# Patient Record
Sex: Male | Born: 1990 | Race: White | Hispanic: No | Marital: Single | Smoking: Current every day smoker
Health system: Southern US, Community
[De-identification: ages and names within clinical notes are randomized; demographics above are authoritative.]

---
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
## 2012-09-05 ENCOUNTER — Emergency Department (HOSPITAL_COMMUNITY): Payer: Self-pay

## 2012-09-05 ENCOUNTER — Emergency Department (HOSPITAL_COMMUNITY)
Admission: EM | Admit: 2012-09-05 | Discharge: 2012-09-05 | Disposition: A | Discharge status: Home / Self Care | Payer: Self-pay | Attending: Emergency Medicine | Admitting: Emergency Medicine

## 2012-09-05 ENCOUNTER — Encounter (HOSPITAL_COMMUNITY): Payer: Self-pay | Admitting: *Deleted

## 2012-09-05 DIAGNOSIS — S93409A Sprain of unspecified ligament of unspecified ankle, initial encounter: Secondary | ICD-10-CM | POA: Insufficient documentation

## 2012-09-05 DIAGNOSIS — Y9367 Activity, basketball: Secondary | ICD-10-CM | POA: Insufficient documentation

## 2012-09-05 DIAGNOSIS — Y9239 Other specified sports and athletic area as the place of occurrence of the external cause: Secondary | ICD-10-CM | POA: Insufficient documentation

## 2012-09-05 DIAGNOSIS — S93402A Sprain of unspecified ligament of left ankle, initial encounter: Secondary | ICD-10-CM

## 2012-09-05 DIAGNOSIS — Y92838 Other recreation area as the place of occurrence of the external cause: Secondary | ICD-10-CM | POA: Insufficient documentation

## 2012-09-05 DIAGNOSIS — X500XXA Overexertion from strenuous movement or load, initial encounter: Secondary | ICD-10-CM | POA: Insufficient documentation

## 2012-09-05 MED ORDER — IBUPROFEN 600 MG PO TABS: 600.0000 mg | ORAL_TABLET | Freq: Four times a day (QID) | ORAL | Status: AC | PRN

## 2012-09-05 NOTE — ED Notes (Addendum)
Pt reports playing basketball and landing wrong on L ankle. C/o pain and swelling.

## 2012-09-05 NOTE — ED Provider Notes (Signed)
Medical screening examination/treatment/procedure(s) were performed by non-physician practitioner and as supervising physician I was immediately available for consultation/collaboration.  Raeford Razor, MD 09/05/12 [phone]

## 2012-09-05 NOTE — ED Provider Notes (Signed)
History     CSN: [id number]  Arrival date & time 09/05/12  1112   First MD Initiated Contact with Patient 09/05/12 1116      Chief Complaint  Patient presents with  . Ankle Injury    (Consider location/radiation/quality/duration/timing/severity/associated sxs/prior treatment) HPI Comments: Patient presents with complaint of left ankle injury sustained yesterday at approximately 6 PM when he was playing basketball. Patient states that he jumped up and landed on the foot of another player and inverted his ankle. Patient states that he could see the bottom of his foot when it bent sideways. He has been unable to bear weight on the foot since the injury. He treated himself with ice, elevation, ibuprofen at home and this is not improved symptoms. He is using crutches to ambulate. Patient denies numbness or tingling of the foot. Onset of symptoms acute. Course is constant. Palpation or movement makes the pain worse. Nothing makes it better.  The history is provided by the patient.    History reviewed. No pertinent past medical history.  History reviewed. No pertinent past surgical history.  No family history on file.  History  Substance Use Topics  . Smoking status: Never Smoker   . Smokeless tobacco: Not on file  . Alcohol Use: Yes     Comment: occasionally      Review of Systems  Constitutional: Negative for activity change.  HENT: Negative for neck pain.   Musculoskeletal: Positive for joint swelling, arthralgias and gait problem. Negative for back pain.  Skin: Negative for wound.  Neurological: Negative for weakness and numbness.    Allergies  Review of patient's allergies indicates no known allergies.  Home Medications   Current Outpatient Rx  Name  Route  Sig  Dispense  Refill  . ibuprofen (ADVIL,MOTRIN) 600 MG tablet   Oral   Take 1 tablet (600 mg total) by mouth every 6 (six) hours as needed for pain.   20 tablet   0     BP 145/89  Pulse 65  Temp(Src)  98.4 F (36.9 C) (Oral)  Resp 16  SpO2 98%  Physical Exam  Vitals reviewed. Constitutional: He appears well-developed and well-nourished.  HENT:  Head: Normocephalic and atraumatic.  Eyes: Conjunctivae are normal.  Neck: Normal range of motion. Neck supple.  Cardiovascular:  Pulses:      Dorsalis pedis pulses are 2+ on the right side, and 2+ on the left side.       Posterior tibial pulses are 2+ on the right side, and 2+ on the left side.  Pulmonary/Chest: No respiratory distress.  Musculoskeletal: Normal range of motion. He exhibits edema and tenderness.  Patient complains of pain with palpation of the medial/lateral left ankle. He denies pain with palpation over the fibular head of the affected side. He denies pain in the hip of the affected side.  Neurological: He is alert.  Distal motor, sensation, and vascular intact.  Skin: Skin is warm and dry.  Psychiatric: He has a normal mood and affect.    ED Course  Procedures (including critical care time)  Labs Reviewed - No data to display Dg Ankle Complete Left  09/05/2012  *RADIOLOGY REPORT*  Clinical Data: Left ankle pain after injury.  LEFT ANKLE COMPLETE - 3+ VIEW  Comparison: None.  Findings: No fracture or dislocation is noted.  Joint spaces are intact.  Soft tissue swelling is seen over lateral malleolus suggesting ligamentous injury.  IMPRESSION: No fracture or dislocation is seen.  Soft tissue swelling over  lateral malleolus is noted suggesting ligamentous injury.   Original Report Authenticated By: Lupita Raider.,  M.D.      1. Ankle sprain, left, initial encounter     11:20 AM Patient seen and examined. Work-up initiated.   Vital signs reviewed and are as follows: Filed Vitals:   09/05/12 1131  BP: 145/89  Pulse: 65  Temp: 98.4 F (36.9 C)  Resp: 16   12:16 PM Xray reviewed by myself. Patient informed. ASO by orthopedic technician.  Patient was counseled on RICE protocol and told to rest injury, use ice  for no longer than 15 minutes every hour, compress the area, and elevate above the level of their heart as much as possible to reduce swelling.  Questions answered.  Patient verbalized understanding.    Encouraged to followup with orthopedist if not improved in one week.  MDM  Ankle injury, negative x-rays. ASO on supportive treatment indicated with orthopedic followup if not improving.        Renne Crigler, PA-C 09/05/12 1217

## 2012-09-05 NOTE — Progress Notes (Signed)
During [facility] ED 09/05/12 visit CM spoke with pt who confirms self pay [facility] resident with no pcp. CM discussed and provided written information for self pay pcps, importance of pcp for f/u care, www.needymeds.org, discounted pharmacies, MATCH program and other [place] resources such as financial assistance, DSS and  health department Reviewed Health connect number to assist with finding self pay provider close to pt's residence. Reviewed resources for [place] self pay pcps like [facility], family medicine at [address], [facility] family practice, general medical clinics, [facility] urgent care plus others, [facility] out patient pharmacies, housing, affordable care act/health reform (deadline 09/11/12) and other resources in [employer]. Pt voiced understanding and appreciation of resources provided  Pt has not signed up for affordable care act marketplace

## 2012-11-15 ENCOUNTER — Encounter (HOSPITAL_COMMUNITY): Payer: Self-pay | Admitting: *Deleted

## 2012-11-15 ENCOUNTER — Emergency Department (INDEPENDENT_AMBULATORY_CARE_PROVIDER_SITE_OTHER)
Admission: EM | Admit: 2012-11-15 | Discharge: 2012-11-15 | Disposition: A | Discharge status: Home / Self Care | Payer: Self-pay | Source: Home / Self Care | Attending: Family Medicine | Admitting: Family Medicine

## 2012-11-15 DIAGNOSIS — S39012A Strain of muscle, fascia and tendon of lower back, initial encounter: Secondary | ICD-10-CM

## 2012-11-15 MED ORDER — KETOROLAC TROMETHAMINE 10 MG PO TABS: 10.0000 mg | ORAL_TABLET | Freq: Four times a day (QID) | ORAL | Status: AC | PRN

## 2012-11-15 MED ORDER — CYCLOBENZAPRINE HCL 5 MG PO TABS: 5.0000 mg | ORAL_TABLET | Freq: Three times a day (TID) | ORAL | Status: AC | PRN

## 2012-11-15 NOTE — ED Notes (Signed)
Pt  Reports  Low  Back  Pain  X  3  Days       Pt  Is  Worse  On  Movements  And  When  He  Is  Sitting  Down    He  denys  Any  specefic  Injury   Other  Than  Lifting  heavt  Tables  Prior  To  The  Symptoms   He  denys  Any  Urinary  Symptoms  Or loss  Of  Bowel  control

## 2012-11-15 NOTE — ED Provider Notes (Signed)
History     CSN: [id number]  Arrival date & time 11/15/12  1057   First MD Initiated Contact with Patient 11/15/12 1228      Chief Complaint  Patient presents with  . Back Pain    (Consider location/radiation/quality/duration/timing/severity/associated sxs/prior treatment) Patient is a 22 y.o. male presenting with back pain. The history is provided by the patient.  Back Pain Location:  Sacro-iliac joint Quality:  Burning Radiates to:  Does not radiate Pain severity:  Mild Onset quality:  Sudden Duration:  3 days Timing:  Constant Progression:  Unchanged Chronicity:  New Context: lifting heavy objects   Associated symptoms: no abdominal pain, no abdominal swelling, no bladder incontinence, no dysuria and no fever     History reviewed. No pertinent past medical history.  History reviewed. No pertinent past surgical history.  History reviewed. No pertinent family history.  History  Substance Use Topics  . Smoking status: Never Smoker   . Smokeless tobacco: Not on file  . Alcohol Use: Yes     Comment: occasionally      Review of Systems  Constitutional: Negative.  Negative for fever.  Gastrointestinal: Negative.  Negative for abdominal pain.  Genitourinary: Negative.  Negative for bladder incontinence and dysuria.  Musculoskeletal: Positive for back pain. Negative for myalgias, joint swelling and gait problem.    Allergies  Review of patient's allergies indicates no known allergies.  Home Medications   Current Outpatient Rx  Name  Route  Sig  Dispense  Refill  . cyclobenzaprine (FLEXERIL) 5 MG tablet   Oral   Take 1 tablet (5 mg total) by mouth 3 (three) times daily as needed for muscle spasms.   30 tablet   0   . ibuprofen (ADVIL,MOTRIN) 600 MG tablet   Oral   Take 1 tablet (600 mg total) by mouth every 6 (six) hours as needed for pain.   20 tablet   0   . ketorolac (TORADOL) 10 MG tablet   Oral   Take 1 tablet (10 mg total) by mouth every 6 (six)  hours as needed for pain.   30 tablet   0     BP 130/79  Pulse 67  Temp(Src) 98.4 F (36.9 C) (Oral)  Resp 16  SpO2 100%  Physical Exam  Nursing note and vitals reviewed. Constitutional: He is oriented to person, place, and time. He appears well-developed and well-nourished.  Abdominal: Soft. Bowel sounds are normal.  Musculoskeletal: He exhibits tenderness.       Lumbar back: He exhibits bony tenderness and pain. He exhibits normal range of motion, no tenderness, no swelling, no spasm and normal pulse.  Neurological: He is alert and oriented to person, place, and time.  Skin: Skin is warm and dry.    ED Course  Procedures (including critical care time)  Labs Reviewed - No data to display No results found.   1. Lumbosacral strain, initial encounter       MDM          Linna Hoff, MD 11/15/12 1329

## 2013-05-25 ENCOUNTER — Encounter (HOSPITAL_COMMUNITY): Payer: Self-pay | Admitting: Emergency Medicine

## 2013-05-25 ENCOUNTER — Emergency Department (HOSPITAL_COMMUNITY)
Admission: EM | Admit: 2013-05-25 | Discharge: 2013-05-25 | Disposition: A | Discharge status: Home / Self Care | Payer: Self-pay | Attending: Emergency Medicine | Admitting: Emergency Medicine

## 2013-05-25 DIAGNOSIS — Z113 Encounter for screening for infections with a predominantly sexual mode of transmission: Secondary | ICD-10-CM | POA: Insufficient documentation

## 2013-05-25 DIAGNOSIS — Z202 Contact with and (suspected) exposure to infections with a predominantly sexual mode of transmission: Secondary | ICD-10-CM

## 2013-05-25 MED ORDER — CEFTRIAXONE SODIUM 250 MG IJ SOLR
250.0000 mg | Freq: Once | INTRAMUSCULAR | Status: AC
  Administered 2013-05-25: 250 mg via INTRAMUSCULAR
  Filled 2013-05-25: qty 250

## 2013-05-25 MED ORDER — AZITHROMYCIN 250 MG PO TABS
1000.0000 mg | ORAL_TABLET | Freq: Once | ORAL | Status: AC
  Administered 2013-05-25: 1000 mg via ORAL
  Filled 2013-05-25: qty 4

## 2013-05-25 NOTE — ED Notes (Signed)
Pt states dating girl who tested positive for chlamydia and wants to be tested, denies drainage, painful urination

## 2013-05-25 NOTE — ED Provider Notes (Signed)
CSN: [id number]     Arrival date & time 05/25/13  1224 History  This chart was scribed for non-physician practitioner, Fayrene Helper, PA-C working with Enid Skeens, MD by Greggory Stallion, ED scribe. This patient was seen in room WTR9/WTR9 and the patient's care was started at 12:49 PM.   Chief Complaint  Patient presents with  . Exposure to STD   The history is provided by the patient. No language interpreter was used.   HPI Comments: Bryan Solis is a 22 y.o. male who presents to the Emergency Department complaining of STD exposure. Pt states he is dating a girl that tested positive for chlamydia 4 days ago and would like to be tested for it. States he has never had an STD in the past. He states he has had several sexual partners in the last six months. Denies abdominal pain, testicular pain, penile discharge, dysuria, rash.   No past medical history on file. No past surgical history on file. No family history on file. History  Substance Use Topics  . Smoking status: Never Smoker   . Smokeless tobacco: Not on file  . Alcohol Use: Yes     Comment: occasionally    Review of Systems  Gastrointestinal: Negative for abdominal pain.  Genitourinary: Negative for dysuria, discharge and testicular pain.  Skin: Negative for rash.    Allergies  Review of patient's allergies indicates no known allergies.  Home Medications   Current Outpatient Rx  Name  Route  Sig  Dispense  Refill  . cyclobenzaprine (FLEXERIL) 5 MG tablet   Oral   Take 1 tablet (5 mg total) by mouth 3 (three) times daily as needed for muscle spasms.   30 tablet   0   . ibuprofen (ADVIL,MOTRIN) 600 MG tablet   Oral   Take 1 tablet (600 mg total) by mouth every 6 (six) hours as needed for pain.   20 tablet   0   . ketorolac (TORADOL) 10 MG tablet   Oral   Take 1 tablet (10 mg total) by mouth every 6 (six) hours as needed for pain.   30 tablet   0    BP 150/86  Pulse 107  Temp(Src) 97.5 F (36.4 C) (Oral)   Resp 17  SpO2 99%  Physical Exam  Nursing note and vitals reviewed. Constitutional: He is oriented to person, place, and time. He appears well-developed and well-nourished. No distress.  HENT:  Head: Normocephalic and atraumatic.  Eyes: EOM are normal.  Neck: Neck supple. No tracheal deviation present.  Cardiovascular: Normal rate, regular rhythm and normal heart sounds.  Exam reveals no gallop and no friction rub.   No murmur heard. Pulmonary/Chest: Effort normal and breath sounds normal. No respiratory distress. He has no wheezes. He has no rales.  Abdominal: Hernia confirmed negative in the right inguinal area and confirmed negative in the left inguinal area.  Genitourinary: Rectum normal, testes normal and penis normal. Circumcised.  No obvious discharge. No rash.   Musculoskeletal: Normal range of motion.  Lymphadenopathy:       Right: No inguinal adenopathy present.       Left: No inguinal adenopathy present.  Neurological: He is alert and oriented to person, place, and time.  Skin: Skin is warm and dry.  Psychiatric: He has a normal mood and affect. His behavior is normal.    ED Course  Procedures (including critical care time)  DIAGNOSTIC STUDIES: Oxygen Saturation is 99% on RA, normal by my interpretation.  COORDINATION OF CARE: 12:57 PM-Discussed treatment plan which includes GC swab and treatment for chlamydia with pt at bedside and pt agreed to plan.   Labs Review Labs Reviewed - No data to display Imaging Review No results found.  EKG Interpretation   None       MDM   1. Exposure to STD    BP 150/86  Pulse 107  Temp(Src) 97.5 F (36.4 C) (Oral)  Resp 17  SpO2 99%  I personally performed the services described in this documentation, which was scribed in my presence. The recorded information has been reviewed and is accurate.    Fayrene Helper, PA-C 05/25/13 1306

## 2013-05-25 NOTE — ED Provider Notes (Signed)
Medical screening examination/treatment/procedure(s) were performed by non-physician practitioner and as supervising physician I was immediately available for consultation/collaboration.  EKG Interpretation   None         Enid Skeens, MD 05/25/13 2041

## 2013-05-26 LAB — GC/CHLAMYDIA PROBE AMP: CT Probe RNA: POSITIVE — AB

## 2013-05-27 ENCOUNTER — Telehealth (HOSPITAL_COMMUNITY): Payer: Self-pay | Admitting: Emergency Medicine

## 2013-05-27 NOTE — ED Notes (Signed)
Patient has +Chlamydia. 

## 2013-05-27 NOTE — ED Notes (Signed)
+  Chlamydia. Patient treated with Rocephin and Zithromax. [employer] faxed. 

## 2014-10-13 ENCOUNTER — Emergency Department (HOSPITAL_BASED_OUTPATIENT_CLINIC_OR_DEPARTMENT_OTHER): Payer: Self-pay

## 2014-10-13 ENCOUNTER — Emergency Department (HOSPITAL_BASED_OUTPATIENT_CLINIC_OR_DEPARTMENT_OTHER)
Admission: EM | Admit: 2014-10-13 | Discharge: 2014-10-13 | Disposition: A | Discharge status: Home / Self Care | Payer: Self-pay | Attending: Emergency Medicine | Admitting: Emergency Medicine

## 2014-10-13 ENCOUNTER — Encounter (HOSPITAL_BASED_OUTPATIENT_CLINIC_OR_DEPARTMENT_OTHER): Payer: Self-pay | Admitting: *Deleted

## 2014-10-13 DIAGNOSIS — Z23 Encounter for immunization: Secondary | ICD-10-CM | POA: Insufficient documentation

## 2014-10-13 DIAGNOSIS — IMO0001 Reserved for inherently not codable concepts without codable children: Secondary | ICD-10-CM

## 2014-10-13 DIAGNOSIS — T81509A Unspecified complication of foreign body accidentally left in body following unspecified procedure, initial encounter: Secondary | ICD-10-CM

## 2014-10-13 DIAGNOSIS — Y9389 Activity, other specified: Secondary | ICD-10-CM | POA: Insufficient documentation

## 2014-10-13 DIAGNOSIS — W25XXXA Contact with sharp glass, initial encounter: Secondary | ICD-10-CM | POA: Insufficient documentation

## 2014-10-13 DIAGNOSIS — Y9289 Other specified places as the place of occurrence of the external cause: Secondary | ICD-10-CM | POA: Insufficient documentation

## 2014-10-13 DIAGNOSIS — Y998 Other external cause status: Secondary | ICD-10-CM | POA: Insufficient documentation

## 2014-10-13 DIAGNOSIS — S60551A Superficial foreign body of right hand, initial encounter: Secondary | ICD-10-CM | POA: Insufficient documentation

## 2014-10-13 MED ORDER — LIDOCAINE-EPINEPHRINE 2 %-1:100000 IJ SOLN
10.0000 mL | Freq: Once | INTRAMUSCULAR | Status: AC
  Administered 2014-10-13: 10 mL

## 2014-10-13 MED ORDER — LIDOCAINE-EPINEPHRINE 2 %-1:100000 IJ SOLN
INTRAMUSCULAR | Status: AC
  Administered 2014-10-13: 10 mL
  Filled 2014-10-13: qty 1

## 2014-10-13 MED ORDER — TETANUS-DIPHTH-ACELL PERTUSSIS 5-2.5-18.5 LF-MCG/0.5 IM SUSP
0.5000 mL | Freq: Once | INTRAMUSCULAR | Status: AC
Start: 2014-10-13 — End: 2014-10-13
  Administered 2014-10-13: 0.5 mL via INTRAMUSCULAR
  Filled 2014-10-13: qty 0.5
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
## 2014-10-13 NOTE — ED Notes (Addendum)
Pt back from xray, alert, NAD, calm, steady gait.

## 2014-10-13 NOTE — ED Notes (Signed)
I completed wound care.
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
## 2015-03-10 ENCOUNTER — Encounter (HOSPITAL_COMMUNITY): Payer: Self-pay | Admitting: Emergency Medicine

## 2015-03-10 ENCOUNTER — Emergency Department (HOSPITAL_COMMUNITY)
Admission: EM | Admit: 2015-03-10 | Discharge: 2015-03-10 | Disposition: A | Discharge status: Home / Self Care | Payer: Self-pay | Attending: Emergency Medicine | Admitting: Emergency Medicine

## 2015-03-10 DIAGNOSIS — X58XXXA Exposure to other specified factors, initial encounter: Secondary | ICD-10-CM | POA: Insufficient documentation

## 2015-03-10 DIAGNOSIS — T782XXA Anaphylactic shock, unspecified, initial encounter: Secondary | ICD-10-CM

## 2015-03-10 DIAGNOSIS — Y998 Other external cause status: Secondary | ICD-10-CM | POA: Insufficient documentation

## 2015-03-10 DIAGNOSIS — Z720 Tobacco use: Secondary | ICD-10-CM | POA: Insufficient documentation

## 2015-03-10 DIAGNOSIS — Y9389 Activity, other specified: Secondary | ICD-10-CM | POA: Insufficient documentation

## 2015-03-10 DIAGNOSIS — Y9289 Other specified places as the place of occurrence of the external cause: Secondary | ICD-10-CM | POA: Insufficient documentation

## 2015-03-10 MED ORDER — EPINEPHRINE 0.3 MG/0.3ML IJ SOAJ: 0.3000 mg | Freq: Once | INTRAMUSCULAR | Status: AC

## 2015-03-10 MED ORDER — METHYLPREDNISOLONE SODIUM SUCC 125 MG IJ SOLR
125.0000 mg | Freq: Once | INTRAMUSCULAR | Status: AC
  Administered 2015-03-10: 125 mg via INTRAVENOUS
  Filled 2015-03-10: qty 2

## 2015-03-10 MED ORDER — DIPHENHYDRAMINE HCL 50 MG/ML IJ SOLN
50.0000 mg | Freq: Once | INTRAMUSCULAR | Status: AC
  Administered 2015-03-10: 50 mg via INTRAVENOUS

## 2015-03-10 MED ORDER — DIPHENHYDRAMINE HCL 50 MG/ML IJ SOLN
INTRAMUSCULAR | Status: DC
Start: 2015-03-10 — End: 2015-03-11
  Filled 2015-03-10: qty 1

## 2015-03-10 MED ORDER — FAMOTIDINE IN NACL 20-0.9 MG/50ML-% IV SOLN
20.0000 mg | Freq: Once | INTRAVENOUS | Status: AC
  Administered 2015-03-10: 20 mg via INTRAVENOUS
  Filled 2015-03-10: qty 50

## 2015-03-10 MED ORDER — PREDNISONE 20 MG PO TABS: ORAL_TABLET | ORAL | Status: AC

## 2015-03-10 MED ORDER — EPINEPHRINE 0.3 MG/0.3ML IJ SOAJ
INTRAMUSCULAR | Status: AC
  Filled 2015-03-10: qty 0.3

## 2015-03-10 MED ORDER — ONDANSETRON HCL 4 MG/2ML IJ SOLN
4.0000 mg | Freq: Once | INTRAMUSCULAR | Status: AC
  Administered 2015-03-10: 4 mg via INTRAVENOUS

## 2015-03-10 MED ORDER — DIPHENHYDRAMINE HCL 25 MG PO TABS: 25.0000 mg | ORAL_TABLET | ORAL | Status: AC | PRN

## 2015-03-10 MED ORDER — FAMOTIDINE 20 MG PO TABS: 20.0000 mg | ORAL_TABLET | Freq: Two times a day (BID) | ORAL | Status: DC

## 2015-03-10 MED ORDER — EPINEPHRINE 0.3 MG/0.3ML IJ SOAJ
0.3000 mg | Freq: Once | INTRAMUSCULAR | Status: AC
  Administered 2015-03-10: 0.3 mg via INTRAMUSCULAR

## 2015-03-10 NOTE — Progress Notes (Signed)
EDCM spoke to patient at bedside. Patient confirms she does not have a pcp or insurance living in [place].  EDCM provided patient with contact information to [facility], informed patient of services there.  EDCM also provided patient with list of pcps who accept self pay patients, list of discount pharmacies and websites needymeds.org and GoodRX.com for medication assistance, phone number to inquire about the orange card, phone number to inquire about Mediciad, phone number to inquire about the Affordable Care Act, financial resources in the community such as local churches, [employer], urban ministries, and dental assistance for uninsured patients.  Patient thankful  for resources.  No further EDCM needs at this time. 

## 2015-03-10 NOTE — ED Notes (Signed)
Pt states that he was outside working picking up tree limbs when he was bitten by couple black ants.  Pt having hives and vomiting.

## 2015-03-10 NOTE — ED Provider Notes (Signed)
CSN: [id number]     Arrival date & time 03/10/15  1721 History   First MD Initiated Contact with Patient 03/10/15 1725     Chief Complaint  Patient presents with  . Allergic Reaction     (Consider location/radiation/quality/duration/timing/severity/associated sxs/prior Treatment) Patient is a 24 y.o. male presenting with allergic reaction and general illness. The history is provided by the patient.  Allergic Reaction Presenting symptoms: rash   Presenting symptoms: no difficulty breathing and no difficulty swallowing   Severity:  Mild Prior allergic episodes:  No prior episodes Context: insect bite/sting   Context: no animal exposure, no food allergies, no grass and no nuts   Relieved by:  None tried Worsened by:  Nothing tried Ineffective treatments:  None tried Illness Severity:  Severe Onset quality:  Gradual Duration:  2 hours Timing:  Constant Progression:  Worsening Chronicity:  New Associated symptoms: nausea, rash and vomiting   Associated symptoms: no fever     History reviewed. No pertinent past medical history. History reviewed. No pertinent past surgical history. No family history on file. Social History  Substance Use Topics  . Smoking status: Current Every Day Smoker    Types: Cigarettes  . Smokeless tobacco: None  . Alcohol Use: Yes     Comment: occasionally    Review of Systems  Constitutional: Negative for fever and chills.  HENT: Negative for trouble swallowing.   Gastrointestinal: Positive for nausea and vomiting.  Endocrine: Negative for polydipsia and polyuria.  Genitourinary: Negative for dysuria and enuresis.  Skin: Positive for rash.  Neurological: Positive for syncope (near).  All other systems reviewed and are negative.     Allergies  Review of patient's allergies indicates no known allergies.  Home Medications   Prior to Admission medications   Medication Sig Start Date End Date Taking? Authorizing Provider   aspirin-acetaminophen-caffeine (EXCEDRIN MIGRAINE) [phone] MG per tablet Take 1 tablet by mouth every 6 (six) hours as needed for headache.   Yes Historical Provider, MD  cyclobenzaprine (FLEXERIL) 5 MG tablet Take 1 tablet (5 mg total) by mouth 3 (three) times daily as needed for muscle spasms. Patient not taking: Reported on 03/10/2015 11/15/12   Linna Hoff, MD  ibuprofen (ADVIL,MOTRIN) 600 MG tablet Take 1 tablet (600 mg total) by mouth every 6 (six) hours as needed for pain. Patient not taking: Reported on 03/10/2015 09/05/12   Renne Crigler, PA-C  ketorolac (TORADOL) 10 MG tablet Take 1 tablet (10 mg total) by mouth every 6 (six) hours as needed for pain. Patient not taking: Reported on 03/10/2015 11/15/12   Linna Hoff, MD   BP 106/62 mmHg  Pulse 91  Temp(Src) 98.2 F (36.8 C) (Oral)  Resp 21  Ht  (1.778 m)  Wt 200 lb (90.719 kg)  BMI 28.70 kg/m2  SpO2 97% Physical Exam  Constitutional: He is oriented to person, place, and time. He appears well-developed and well-nourished.  HENT:  Head: Normocephalic and atraumatic.  Eyes: Conjunctivae and EOM are normal.  Neck: Normal range of motion. Neck supple.  Cardiovascular: Normal rate and regular rhythm.   Pulmonary/Chest: Effort normal. No respiratory distress.  Abdominal: Soft. There is no tenderness. There is no rebound.  Musculoskeletal: Normal range of motion. He exhibits no edema or tenderness.  Neurological: He is alert and oriented to person, place, and time.  Skin: Skin is warm and dry. Rash noted. There is pallor.  Nursing note and vitals reviewed.   ED Course  Procedures (including critical care  time) CRITICAL CARE Performed by: Marily Memos   Total critical care time: 35 minutes  Critical care time was exclusive of separately billable procedures and treating other patients.  Critical care was necessary to treat or prevent imminent or life-threatening deterioration.  Critical care was time spent  personally by me on the following activities: development of treatment plan with patient and/or surrogate as well as nursing, discussions with consultants, evaluation of patient's response to treatment, examination of patient, obtaining history from patient or surrogate, ordering and performing treatments and interventions, ordering and review of laboratory studies, ordering and review of radiographic studies, pulse oximetry and re-evaluation of patient's condition.   Labs Review Labs Reviewed - No data to display  Imaging Review No results found. I have personally reviewed and evaluated these images and lab results as part of my medical decision-making.   EKG Interpretation None      MDM   Final diagnoses:  Anaphylaxis, initial encounter   Anaphylaxis, epi given. Had a vagal reaction shortly after epi shot (nauseated, low HR, low BP, pale skin, near syncope) recovered quickly. Will obs for at least four hours and continue meds at home with rx for epi pen. Multiple re-evaluations the patient improving rash, normal blood pressure is no vomiting or belly pain. There is enlarged for 5 hours with total resolution symptoms. Prescriptions for EpiPen, steroids, Pepcid and Benadryl given. Will follow with allergist as well.  I have personally and contemperaneously reviewed labs and imaging and used in my decision making as above.   A medical screening exam was performed and I feel the patient has had an appropriate workup for their chief complaint at this time and likelihood of emergent condition existing is low. They have been counseled on decision, discharge, follow up and which symptoms necessitate immediate return to the emergency department. They or their family verbally stated understanding and agreement with plan and discharged in stable condition.     Marily Memos, MD 03/11/15 0002

## 2015-05-01 ENCOUNTER — Encounter (HOSPITAL_COMMUNITY): Payer: Self-pay | Admitting: Emergency Medicine

## 2015-05-01 ENCOUNTER — Emergency Department (HOSPITAL_COMMUNITY)
Admission: EM | Admit: 2015-05-01 | Discharge: 2015-05-01 | Disposition: A | Discharge status: Home / Self Care | Payer: Self-pay | Attending: Emergency Medicine | Admitting: Emergency Medicine

## 2015-05-01 DIAGNOSIS — Z79899 Other long term (current) drug therapy: Secondary | ICD-10-CM | POA: Insufficient documentation

## 2015-05-01 DIAGNOSIS — T63444A Toxic effect of venom of bees, undetermined, initial encounter: Secondary | ICD-10-CM | POA: Insufficient documentation

## 2015-05-01 DIAGNOSIS — Y998 Other external cause status: Secondary | ICD-10-CM | POA: Insufficient documentation

## 2015-05-01 DIAGNOSIS — S0183XA Puncture wound without foreign body of other part of head, initial encounter: Secondary | ICD-10-CM | POA: Insufficient documentation

## 2015-05-01 DIAGNOSIS — Y9289 Other specified places as the place of occurrence of the external cause: Secondary | ICD-10-CM | POA: Insufficient documentation

## 2015-05-01 DIAGNOSIS — X58XXXA Exposure to other specified factors, initial encounter: Secondary | ICD-10-CM | POA: Insufficient documentation

## 2015-05-01 DIAGNOSIS — F17210 Nicotine dependence, cigarettes, uncomplicated: Secondary | ICD-10-CM | POA: Insufficient documentation

## 2015-05-01 DIAGNOSIS — Y9389 Activity, other specified: Secondary | ICD-10-CM | POA: Insufficient documentation

## 2015-05-01 MED ORDER — DEXAMETHASONE SODIUM PHOSPHATE 10 MG/ML IJ SOLN
10.0000 mg | Freq: Once | INTRAMUSCULAR | Status: AC
  Administered 2015-05-01: 10 mg via INTRAMUSCULAR
  Filled 2015-05-01: qty 1

## 2015-05-01 MED ORDER — FAMOTIDINE 20 MG PO TABS: 20.0000 mg | ORAL_TABLET | Freq: Two times a day (BID) | ORAL | Status: AC

## 2015-05-01 MED ORDER — DIPHENHYDRAMINE HCL 25 MG PO CAPS
50.0000 mg | ORAL_CAPSULE | Freq: Once | ORAL | Status: AC
  Administered 2015-05-01: 50 mg via ORAL
  Filled 2015-05-01: qty 2

## 2015-05-01 MED ORDER — FAMOTIDINE 20 MG PO TABS
20.0000 mg | ORAL_TABLET | Freq: Once | ORAL | Status: AC
  Administered 2015-05-01: 20 mg via ORAL
  Filled 2015-05-01: qty 1

## 2015-05-01 NOTE — Discharge Instructions (Signed)
1 to 2 tablets of 25 mg Benadryl pills every 4-6 hours as needed to a maximum of 300 mg per day. In addition, you may apply a topical hydrocortisone ointment to all affected areas except for the face.   Do not hesitate to call 911 or return to the emergency room if you develop any shortness of breath, wheezing, tongue or lip swelling.   Bee, Wasp, or [employer], wasps, and hornets are part of a family of insects that can sting people. These stings can cause pain and inflammation, but they are usually not serious. However, some people may have an allergic reaction to a sting. This can cause the symptoms to be more severe.  SYMPTOMS  Common symptoms of this condition include:   A red lump in the skin that sometimes has a tiny hole in the center. In some cases, a stinger may be in the center of the wound.  Pain and itching at the sting site.  Redness and swelling around the sting site. If you have an allergic reaction (localized allergic reaction), the swelling and redness may spread out from the sting site. In some cases, this reaction can continue to develop over the next 12-36 hours. In rare cases, a person may have a severe allergic reaction (anaphylactic reaction) to a sting. Symptoms of an anaphylactic reaction may include:   Wheezing or difficulty breathing.  Raised, itchy, red patches on the skin.  Nausea or vomiting.  Abdominal cramping.  Diarrhea.  Chest pain.  Fainting.  Redness of the face (flushing). DIAGNOSIS  This condition is usually diagnosed based on symptoms, medical history, and a physical exam. TREATMENT  Most stings can be treated with:   Icing to reduce swelling.  Medicines (antihistamines) to treat itching or an allergic reaction.  Medicines to help reduce pain. These may be medicines that you take by mouth, or medicated creams or lotions that you apply to your skin. If you were stung by a bee, the stinger and a small sac of poison may be in the  wound. This may be removed by brushing across it with a flat card, such as a credit card. Another method is to pinch the area and pull it out. These methods can help reduce the severity of the body's reaction to the sting.  HOME CARE INSTRUCTIONS   Wash the sting site daily with soap and water as told by your health care provider.  Apply or take over-the-counter and prescription medicines only as told by your health care provider.  If directed, apply ice to the sting area.  Put ice in a plastic bag.  Place a towel between your skin and the bag.  Leave the ice on for 20 minutes, 2-3 times per day.  Do not scratch the sting area.  To lessen pain, try using a paste that is made of water and baking soda. Rub the paste on the sting area and leave it on for 5 minutes.  If you had a severe allergic reaction to a sting, you may need:  To wear a medical bracelet or necklace that lists the allergy.  To learn when and how to use an anaphylaxis kit or epinephrine injection. Your family members may also need to learn this.  To carry an anaphylaxis kit with you at all times. SEEK MEDICAL CARE IF:   Your symptoms do not get better in 2-3 days.  You have redness, swelling, or pain that spreads beyond the area of the sting.  You  have a fever. SEEK IMMEDIATE MEDICAL CARE IF:  You have symptoms of a severe allergic reaction. These include:   Wheezing or difficulty breathing.  Chest pain.  Light-headedness or fainting.  Itchy, raised, red patches on the skin.  Nausea or vomiting.  Abdominal cramping.  Diarrhea.   This information is not intended to replace advice given to you by your health care provider. Make sure you discuss any questions you have with your health care provider.   Document Released: 05/31/2005 Document Revised: 02/19/2015 Document Reviewed: 10/16/2014 Elsevier Interactive Patient Education [employer].

## 2015-05-01 NOTE — ED Notes (Signed)
Facial swelling decreased. Sent home with ice pack and AVS. AVS explained in detail. No other c/c. No wheezing or SOB noted. Ambulatory with steady gait. Knows to continue taking Benadryl and using hydrocortisone cream.

## 2015-05-01 NOTE — ED Notes (Signed)
Pt stung by bee to right temple around 1:30 this afternoon. Area around right eye red and swollen, still able to open eye, no visual changes, no difficulty breathing or chest tightness. Pt states his feet were itching earlier, states the swelling and itching have both decreased over the last 30 minutes.

## 2015-05-01 NOTE — ED Provider Notes (Signed)
History  By signing my name below, I, Karle Plumber, attest that this documentation has been prepared under the direction and in the presence of [employer], PA-C. Electronically Signed: Karle Plumber, ED Scribe. 05/01/2015. 3:33 PM.  Chief Complaint  Patient presents with  . Allergic Reaction  . Insect Bite   The history is provided by the patient and medical records. No language interpreter was used.    HPI Comments:  Bryan Solis is a 24 y.o. male who presents to the Emergency Department complaining of a bee sting to the right temple that occurred approximately two hours ago. Pt reports associated swelling to the right eye and bilateral feet itching that has since resolved. He also reports some SOB that has resolved as well. He states about one month ago he was bitten by black ants and had completely worse symptoms that included the bilateral feet itching. He states he was concerned because the feet were itching and wanted to make sure he did not experience the same symptoms as before. He has not taken anything as treatment for the symptoms. He denies modifying factors. He denies current SOB, nausea, vomiting, wheezing, tongue or lip swelling, difficulty swallowing or rash. He states his tetanus vaccination is UTD.  History reviewed. No pertinent past medical history. History reviewed. No pertinent past surgical history. History reviewed. No pertinent family history. Social History  Substance Use Topics  . Smoking status: Current Every Day Smoker    Types: Cigarettes  . Smokeless tobacco: None  . Alcohol Use: Yes     Comment: occasionally    Review of Systems A complete 10 system review of systems was obtained and all systems are negative except as noted in the HPI and PMH.   Allergies  Review of patient's allergies indicates no known allergies.  Home Medications   Prior to Admission medications   Medication Sig Start Date End Date Taking? Authorizing Provider   aspirin-acetaminophen-caffeine (EXCEDRIN MIGRAINE) [phone] MG per tablet Take 1 tablet by mouth every 6 (six) hours as needed for headache.    Historical Provider, MD  cyclobenzaprine (FLEXERIL) 5 MG tablet Take 1 tablet (5 mg total) by mouth 3 (three) times daily as needed for muscle spasms. Patient not taking: Reported on 03/10/2015 11/15/12   Linna Hoff, MD  diphenhydrAMINE (BENADRYL) 25 MG tablet Take 1 tablet (25 mg total) by mouth every 4 (four) hours as needed for itching. 03/10/15   Marily Memos, MD  EPINEPHrine 0.3 mg/0.3 mL IJ SOAJ injection Inject 0.3 mLs (0.3 mg total) into the muscle once. 03/10/15   Marily Memos, MD  famotidine (PEPCID) 20 MG tablet Take 1 tablet (20 mg total) by mouth 2 (two) times daily. 03/10/15   Marily Memos, MD  ibuprofen (ADVIL,MOTRIN) 600 MG tablet Take 1 tablet (600 mg total) by mouth every 6 (six) hours as needed for pain. Patient not taking: Reported on 03/10/2015 09/05/12   Renne Crigler, PA-C  ketorolac (TORADOL) 10 MG tablet Take 1 tablet (10 mg total) by mouth every 6 (six) hours as needed for pain. Patient not taking: Reported on 03/10/2015 11/15/12   Linna Hoff, MD  predniSONE (DELTASONE) 20 MG tablet 2 tabs po daily x 4 days 03/11/15   Marily Memos, MD   Triage Vitals: BP 132/83 mmHg  Pulse 107  Temp(Src) 98.1 F (36.7 C) (Oral)  Resp 18  SpO2 98% Physical Exam  Constitutional: He is oriented to person, place, and time. He appears well-developed and well-nourished. No distress.  HENT:  Head: Normocephalic and atraumatic.  Mouth/Throat: Oropharynx is clear and moist.  Eyes: Conjunctivae and EOM are normal. Pupils are equal, round, and reactive to light.  Puncture wound to right temple. Mild right-sided periorbital edema without warmth.  Neck: Normal range of motion.  Cardiovascular: Normal rate, regular rhythm and intact distal pulses.   Pulmonary/Chest: Effort normal and breath sounds normal.  No stridor or drooling. No posterior pharynx  edema, lip or tongue swelling. Pt reclining comfortably, speaking in complete sentences.   No Wheezing, excellent air movement in all fields.     Abdominal: Soft. Bowel sounds are normal. There is no tenderness.  Musculoskeletal: Normal range of motion.  Neurological: He is alert and oriented to person, place, and time.  Skin: Skin is warm and dry. No rash noted. He is not diaphoretic.  Psychiatric: He has a normal mood and affect. His behavior is normal.  Nursing note and vitals reviewed.   ED Course  Procedures (including critical care time) DIAGNOSTIC STUDIES: Oxygen Saturation is 98% on RA, normal by my interpretation.   COORDINATION OF CARE: 3:27 PM- Will order injection of Decadron, oral Benadryl and Pepcid. Will monitor patient before discharge. Return precautions discussed. Advised pt to apply ice to the area. Pt verbalizes understanding and agrees to plan.  Medications  dexamethasone (DECADRON) injection 10 mg (not administered)  famotidine (PEPCID) tablet 20 mg (not administered)  diphenhydrAMINE (BENADRYL) capsule 50 mg (not administered)    MDM   Final diagnoses:  None    Filed Vitals:   05/01/15 1511  BP: 132/83  Pulse: 107  Temp: 98.1 F (36.7 C)  TempSrc: Oral  Resp: 18  SpO2: 98%    Medications  dexamethasone (DECADRON) injection 10 mg (10 mg Intramuscular Given 05/01/15 1551)  famotidine (PEPCID) tablet 20 mg (20 mg Oral Given 05/01/15 1551)  diphenhydrAMINE (BENADRYL) capsule 50 mg (50 mg Oral Given 05/01/15 1551)    Bryan Solis is 24 y.o. male presenting with bee sting to right temple and periorbital edema. Patient states his feet were itching earlier and that prompted him to seek care because he had an anaphylactic reaction that started with his feet itching. 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 There was no medication administered beforehand, EpiPen was not given.   Does not meet criteria for anaphylaxis and  epinephrine administration: no multisystem involvement, airway compromise or hypotension. Pt given decadron, benadryl and pepcid with significant improvement in ED.   Recheck in the ED shows slightly improved periorbital edema and normal lung, heart and skin exam, no indication for epinephrine administration, not a anaphylactic reaction.  Evaluation does not show pathology that would require ongoing emergent intervention or inpatient treatment. Pt is hemodynamically stable and mentating appropriately. Discussed findings and plan with patient/guardian, who agrees with care plan. All questions answered. Return precautions discussed and outpatient follow up given.   I personally performed the services described in this documentation, which was scribed in my presence. The recorded information has been reviewed and is accurate.     Wynetta Emeryicole Nico Syme, PA-C 05/01/15 1645  Marily MemosJason Mesner, MD 05/01/15 1718

## 2016-10-08 IMAGING — CR DG HAND 2V*R*
2 series · 2 of 2 positions shown · non-contrast
Comparison: None.

CLINICAL DATA: Initial encounter for trauma 1 month ago. Pain and
wound around the proximal first metacarpal.

EXAM:
RIGHT HAND - 2 VIEW

[x hand pa right]
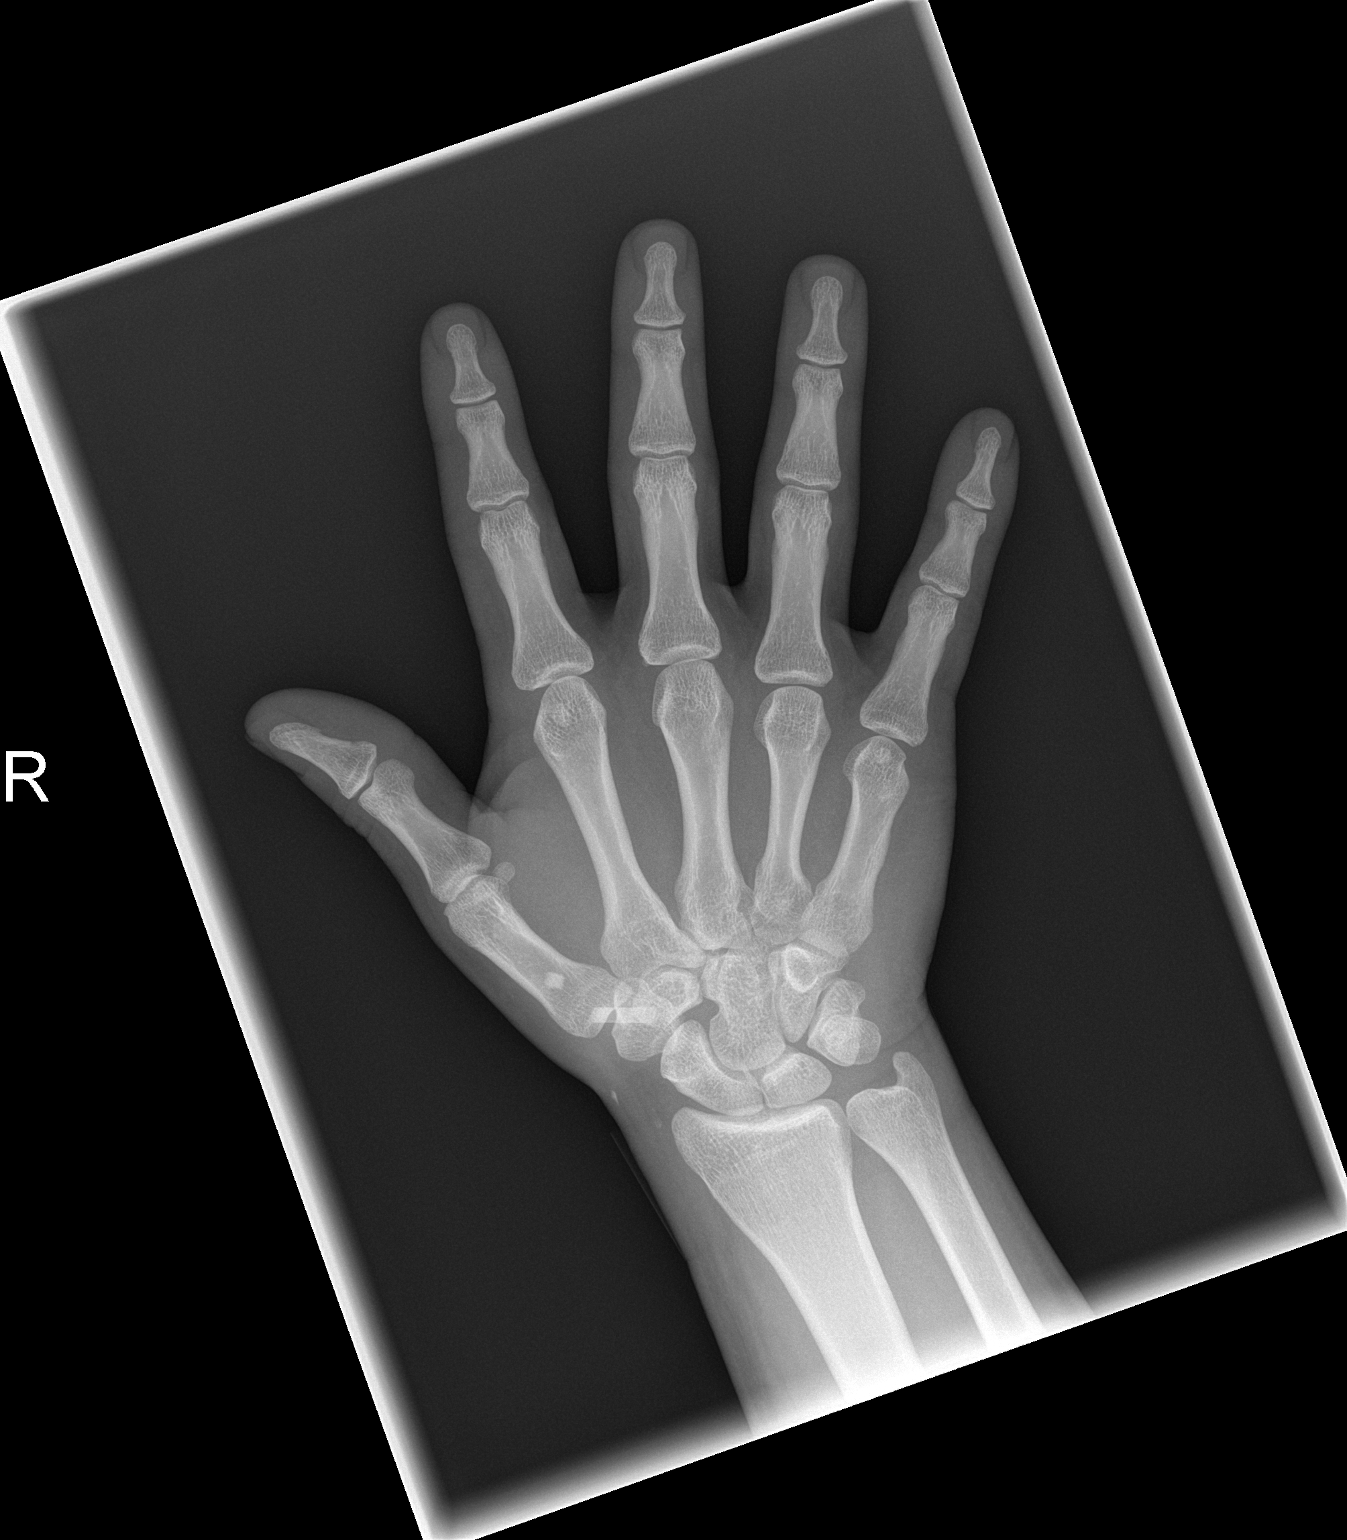

[x hand lat right]
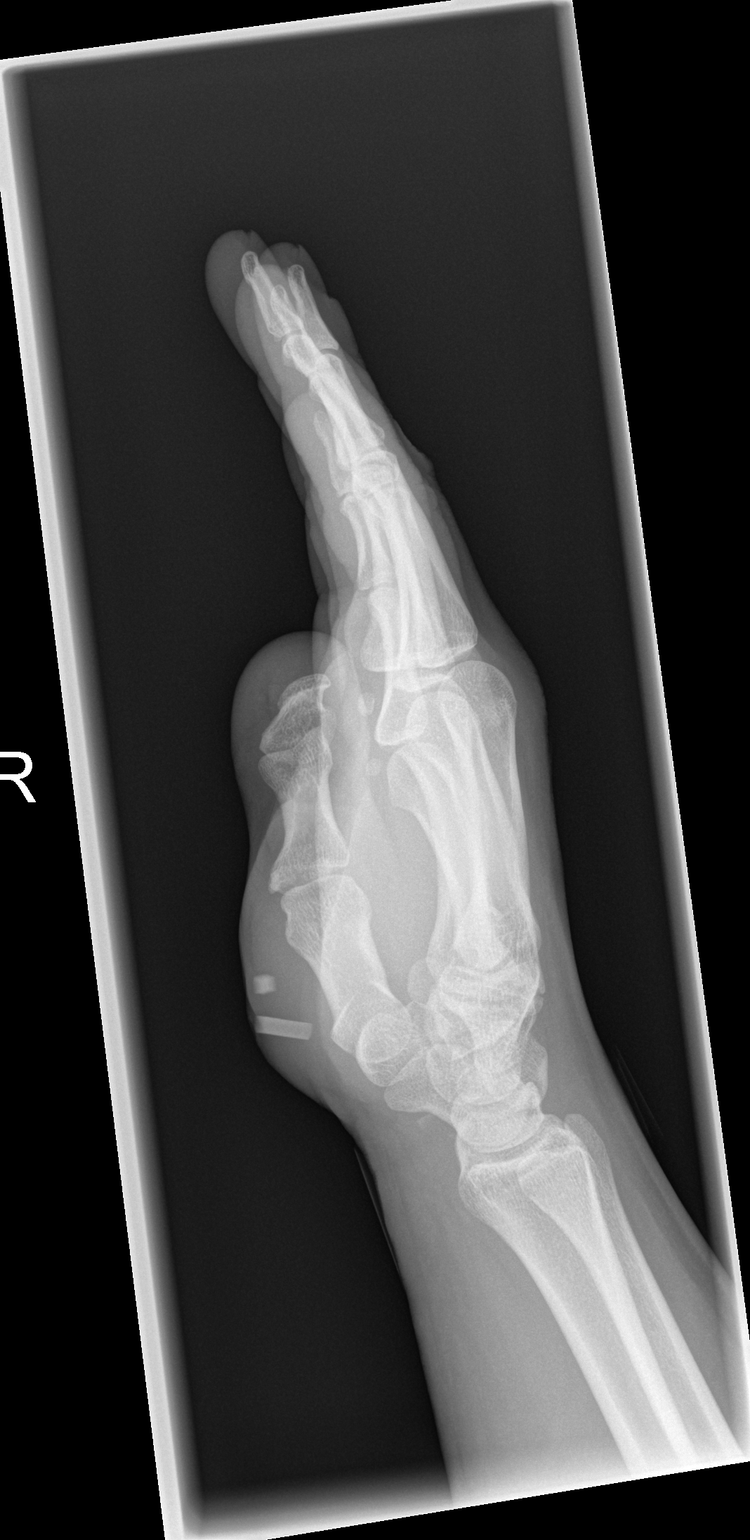

[2 of 2 positions shown; findings below may reference images not displayed]

FINDINGS: AP and lateral views. No acute fracture or dislocation. Radiopaque
foreign objects project about the radial aspect of the carpal bones
and the thenar eminence. There is extensive overlap of fingers on
the lateral view.
IMPRESSION: No acute osseous abnormality.

Radiopaque foreign objects projecting about the radial portion of
the proximal hand.
